# Patient Record
Sex: Male | Born: 1960 | Race: Black or African American | Hispanic: No | Marital: Single
Health system: Midwestern US, Community
[De-identification: ages and names within clinical notes are randomized; demographics above are authoritative.]

## PROBLEM LIST (undated history)

## (undated) HISTORY — PX: ANKLE SURGERY: SHX546

---

## 2013-04-10 ENCOUNTER — Emergency Department (HOSPITAL_COMMUNITY): Payer: Worker's Compensation

## 2013-04-10 ENCOUNTER — Encounter (HOSPITAL_COMMUNITY): Payer: Self-pay | Admitting: Emergency Medicine

## 2013-04-10 ENCOUNTER — Emergency Department (HOSPITAL_COMMUNITY)
Admission: EM | Admit: 2013-04-10 | Discharge: 2013-04-10 | Disposition: A | Discharge status: Home / Self Care | Payer: Worker's Compensation | Attending: Emergency Medicine | Admitting: Emergency Medicine

## 2013-04-10 DIAGNOSIS — Y92009 Unspecified place in unspecified non-institutional (private) residence as the place of occurrence of the external cause: Secondary | ICD-10-CM | POA: Insufficient documentation

## 2013-04-10 DIAGNOSIS — S51809A Unspecified open wound of unspecified forearm, initial encounter: Secondary | ICD-10-CM | POA: Insufficient documentation

## 2013-04-10 DIAGNOSIS — S6980XA Other specified injuries of unspecified wrist, hand and finger(s), initial encounter: Secondary | ICD-10-CM | POA: Insufficient documentation

## 2013-04-10 DIAGNOSIS — S6990XA Unspecified injury of unspecified wrist, hand and finger(s), initial encounter: Secondary | ICD-10-CM | POA: Insufficient documentation

## 2013-04-10 DIAGNOSIS — T148XXA Other injury of unspecified body region, initial encounter: Secondary | ICD-10-CM

## 2013-04-10 DIAGNOSIS — W540XXA Bitten by dog, initial encounter: Secondary | ICD-10-CM | POA: Insufficient documentation

## 2013-04-10 DIAGNOSIS — W230XXA Caught, crushed, jammed, or pinched between moving objects, initial encounter: Secondary | ICD-10-CM | POA: Insufficient documentation

## 2013-04-10 DIAGNOSIS — Y9389 Activity, other specified: Secondary | ICD-10-CM | POA: Insufficient documentation

## 2013-04-10 DIAGNOSIS — Y990 Civilian activity done for income or pay: Secondary | ICD-10-CM | POA: Insufficient documentation

## 2013-04-10 MED ORDER — AMOXICILLIN-POT CLAVULANATE 875-125 MG PO TABS
1.0000 | ORAL_TABLET | Freq: Once | ORAL | Status: AC
  Administered 2013-04-10: 1 via ORAL
  Filled 2013-04-10: qty 1

## 2013-04-10 MED ORDER — HYDROCODONE-ACETAMINOPHEN 5-325 MG PO TABS: 1.0000 | ORAL_TABLET | ORAL | Status: DC | PRN

## 2013-04-10 MED ORDER — AMOXICILLIN-POT CLAVULANATE 875-125 MG PO TABS: 1.0000 | ORAL_TABLET | Freq: Two times a day (BID) | ORAL | Status: DC

## 2013-04-10 NOTE — ED Notes (Signed)
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

## 2013-04-10 NOTE — ED Provider Notes (Signed)
Medical screening examination/treatment/procedure(s) were performed by non-physician practitioner and as supervising physician I was immediately available for consultation/collaboration.  EKG Interpretation   None        Courtney F Horton, MD 04/10/13 2327 

## 2013-04-10 NOTE — ED Provider Notes (Signed)
CSN: [id number]     Arrival date & time 04/10/13  1924 History  This chart was scribed for non-physician practitioner Ruby Cola, PA-C, working with Shon Baton, MD by Dorothey Baseman, ED Scribe. This patient was seen in room WTR6/WTR6 and the patient's care was started at 9:13 PM.    Chief Complaint  Patient presents with  . Animal Bite   The history is provided by the patient. No language interpreter was used.   HPI Comments: Mark Sullivan is a 52 y.o. male who presents to the Emergency Department complaining of an unprovoked dog bite with two puncture wounds to the left forearm that occurred around 4 hours ago. There is no active bleeding at this time. Patient reports that he was picking up donated furniture and when the owner came outside she let the dog out and it jumped up to "go for my throat" and he used his arm to shield himself.  Had a coat on but he bit through it. He reports that he hit the dog with his right hand to try and get it to let go and that caused his thumb to "jam," causing some limited range of motion of the thumb. Patient reports an associated, sore pain to the areas. He states that police and animal control were at the scene. Patient reports that his tetanus vaccination is UTD. He denies any allergies to medications. Patient has no other pertinent medical history.   History reviewed. No pertinent past medical history. Past Surgical History  Procedure Laterality Date  . Ankle surgery     Family History  Problem Relation Age of Onset  . Diabetes Other    History  Substance Use Topics  . Smoking status: Never Smoker   . Smokeless tobacco: Not on file  . Alcohol Use: Yes     Comment: rare    Review of Systems  A complete 10 system review of systems was obtained and all systems are negative except as noted in the HPI and PMH.   Allergies  Review of patient's allergies indicates no known allergies.  Home Medications  No current outpatient  prescriptions on file.  Triage Vitals: BP 146/96  Pulse 67  Temp(Src) 98.2 F (36.8 C) (Oral)  Resp 20  Ht 5\' 7"  (1.702 m)  Wt 186 lb 4 oz (84.482 kg)  BMI 29.16 kg/m2  SpO2 97%  Physical Exam  Nursing note and vitals reviewed. Constitutional: He is oriented to person, place, and time. He appears well-developed and well-nourished. No distress.  HENT:  Head: Normocephalic and atraumatic.  Eyes:  Normal appearance  Neck: Normal range of motion.  Cardiovascular: Intact distal pulses.   Pulmonary/Chest: Effort normal.  Musculoskeletal: Normal range of motion.  Neurological: He is alert and oriented to person, place, and time.  Distal sensation of all fingers is intact.   Skin:  Multiple tiny superficial and scabbed abrasions of extensor surface of right forearm.  No associated edema or ecchymosis.  All ttp.  There is a superficial, linear, hemostatic abrasion at distal forearm.  No bony tenderness.  Full ROM of elbow and wrist.  2+ radial pulse and distal sensation intact.  Right thumb w/out deformity, edema, ecchymosis.  Diffuse ttp and pain w/ passive flexion of DIP joint.  No tenderness at anatomical snuffbox. Distal sensation intact.   Psychiatric: He has a normal mood and affect. His behavior is normal.    ED Course  Procedures (including critical care time)  DIAGNOSTIC STUDIES: Oxygen Saturation is 97%  on room air, normal by my interpretation.    COORDINATION OF CARE: 9:18 PM- Will order an x-ray of the right thumb. Will discharge patient with antibiotics to treat possible infection. Discussed treatment plan with patient at bedside and patient verbalized agreement.     Labs Review Labs Reviewed - No data to display  Imaging Review Dg Finger Thumb Right  04/10/2013   CLINICAL DATA:  Recent traumatic injury with pain  EXAM: RIGHT THUMB 2+V  COMPARISON:  None.  FINDINGS: There is no evidence of fracture or dislocation. There is no evidence of arthropathy or other focal  bone abnormality. Soft tissues are unremarkable  IMPRESSION: No acute abnormality noted.   Electronically Signed   By: Alcide Clever M.D.   On: 04/10/2013 21:32    EKG Interpretation   None       MDM   1. Animal bite    Healthy 52yo M presents w/ c/o dog bite to L forearm that occurred this evening.  The dog apparently bit him through his coat.  Wounds on forearm are superficial and appear old, w/ exception of small, hemostatic scratch to extensor surface of wrist, and there are no holes in his coat.  I am suspicious that there is some secondary gain.  This is a worker's comp case.  The police and animal control were at scene.  Nursing staff cleaned wounds and he received a dose of augmentin.  Tetanus up to date.  Xray of R thumb ordered because he "jammed it" while defending himself and has pain and decreased ROM, but negative.     I personally performed the services described in this documentation, which was scribed in my presence. The recorded information has been reviewed and is accurate.     Otilio Miu, PA-C 04/10/13 2202

## 2014-06-17 IMAGING — CR DG FINGER THUMB 2+V*R*
3 series · 3 of 3 positions shown · non-contrast
Comparison: None.

CLINICAL DATA: Recent traumatic injury with pain

EXAM:
RIGHT THUMB 2+V

[x finger pa right]
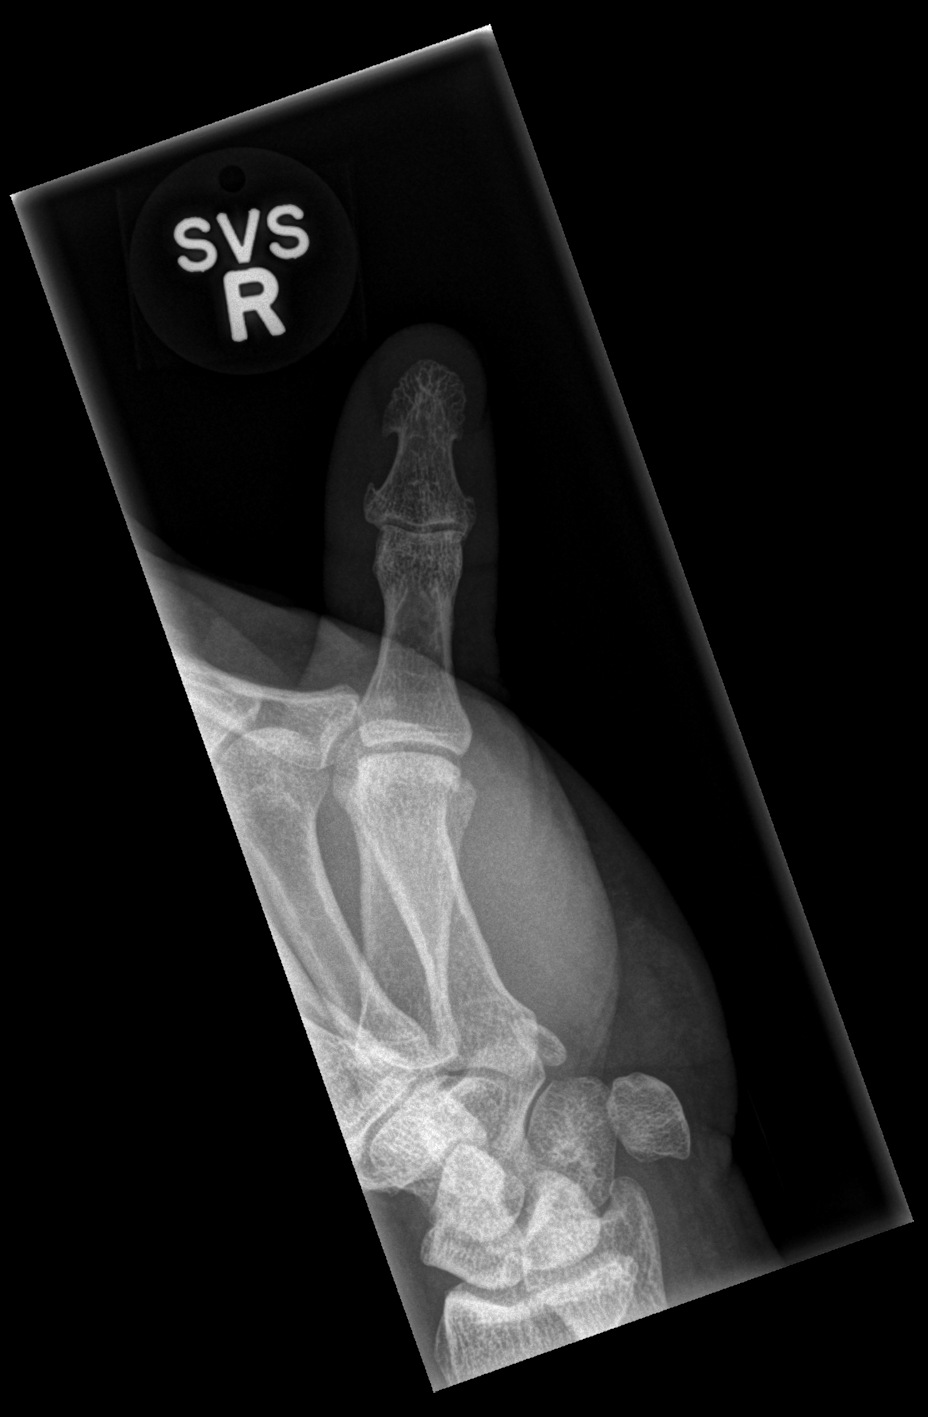

[x finger obl right]
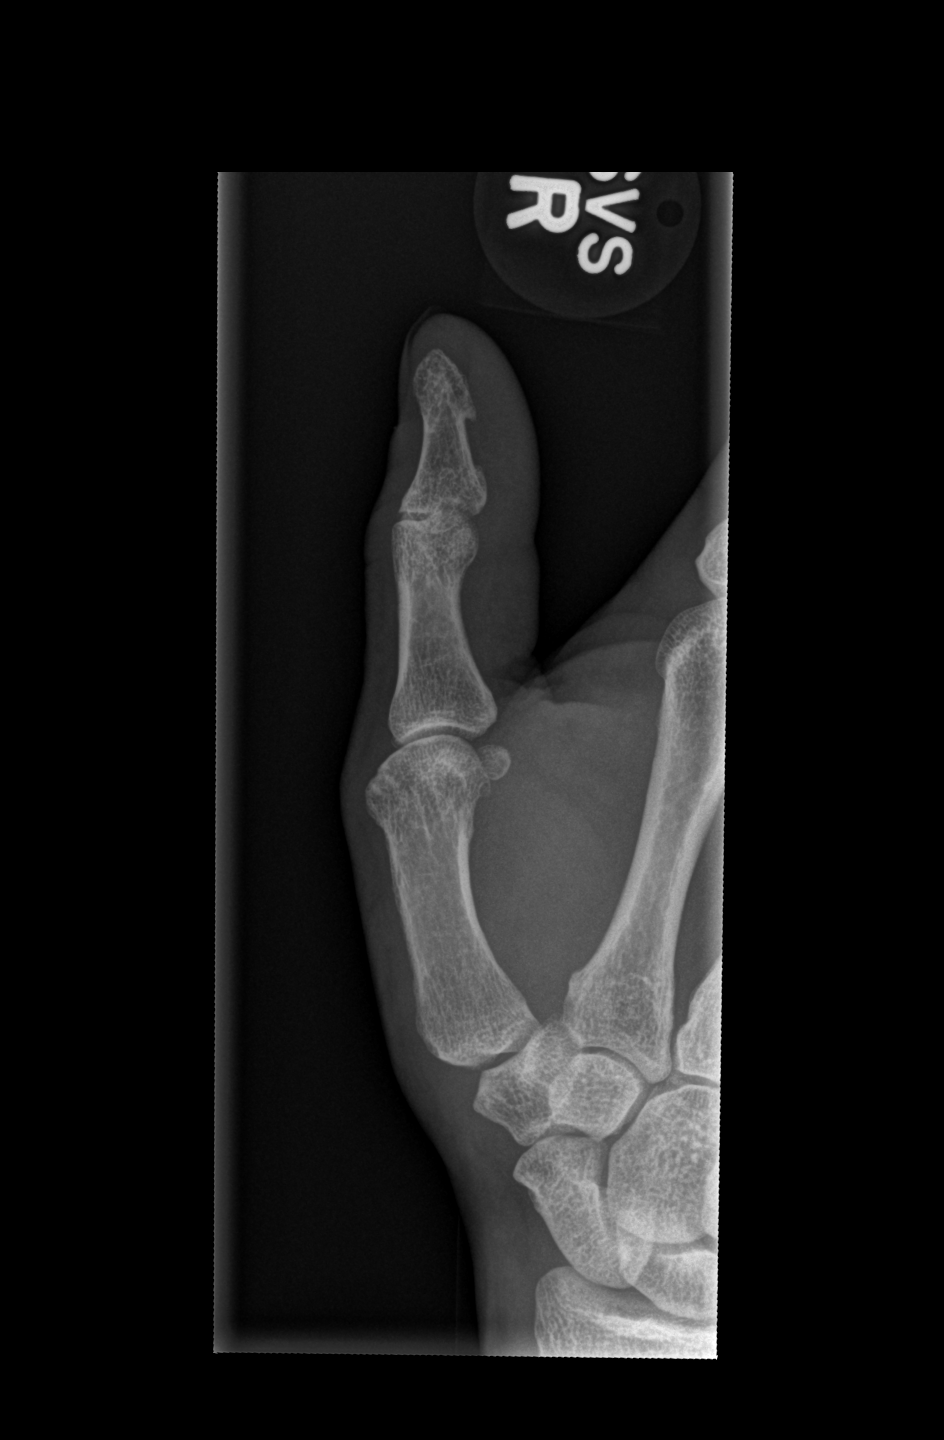

[x finger lat right]
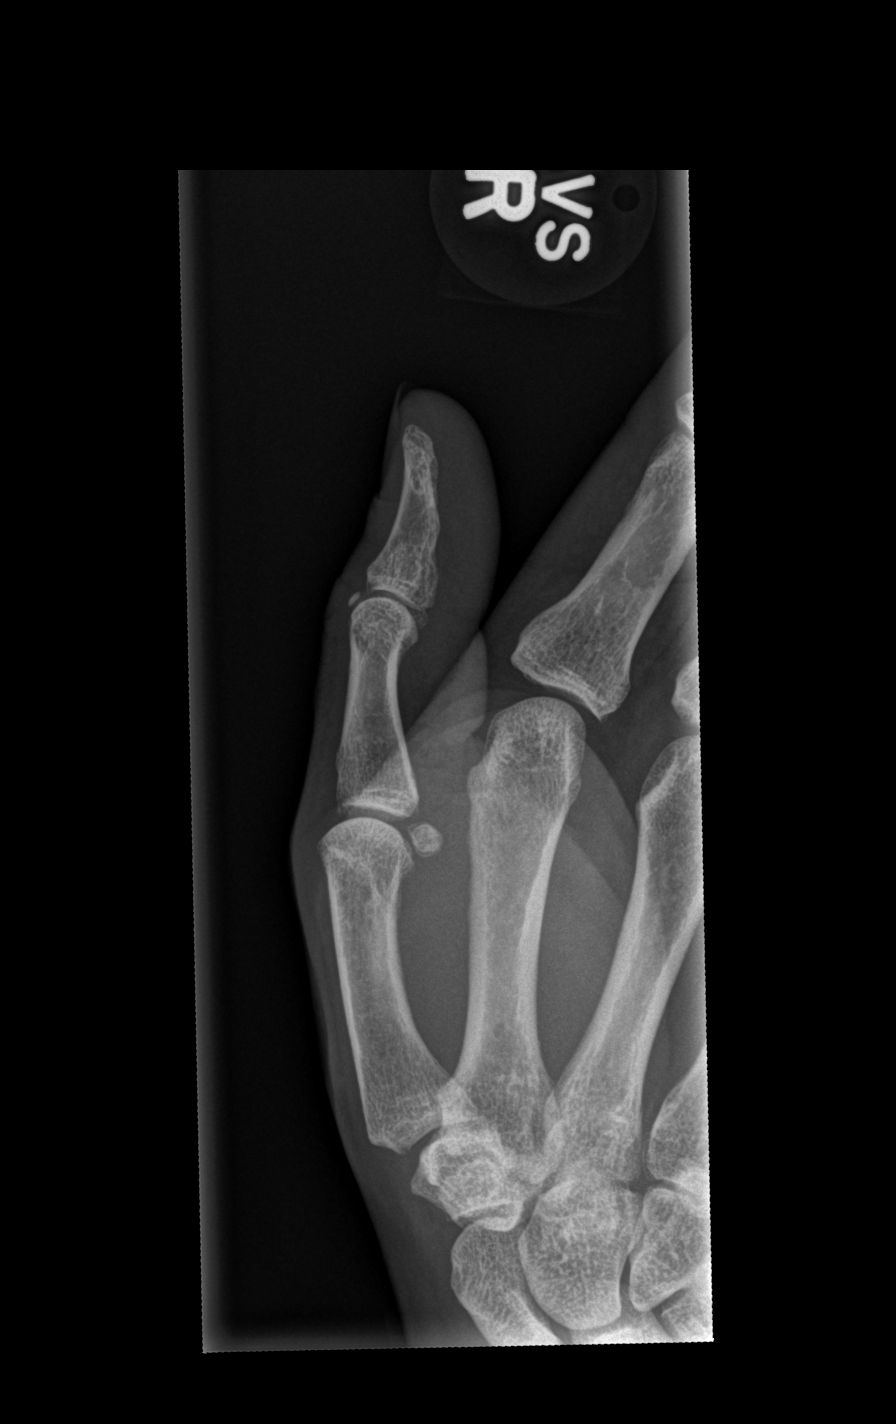

[3 of 3 positions shown; findings below may reference images not displayed]

FINDINGS: There is no evidence of fracture or dislocation. There is no
evidence of arthropathy or other focal bone abnormality. Soft
tissues are unremarkable
IMPRESSION: No acute abnormality noted.

## 2015-05-22 ENCOUNTER — Emergency Department (HOSPITAL_COMMUNITY)
Admission: EM | Admit: 2015-05-22 | Discharge: 2015-05-23 | Disposition: A | Discharge status: Home / Self Care | Payer: BLUE CROSS/BLUE SHIELD | Attending: Emergency Medicine | Admitting: Emergency Medicine

## 2015-05-22 ENCOUNTER — Encounter (HOSPITAL_COMMUNITY): Payer: Self-pay | Admitting: Emergency Medicine

## 2015-05-22 DIAGNOSIS — M791 Myalgia: Secondary | ICD-10-CM | POA: Insufficient documentation

## 2015-05-22 DIAGNOSIS — R61 Generalized hyperhidrosis: Secondary | ICD-10-CM | POA: Diagnosis not present

## 2015-05-22 DIAGNOSIS — R109 Unspecified abdominal pain: Secondary | ICD-10-CM | POA: Insufficient documentation

## 2015-05-22 DIAGNOSIS — R51 Headache: Secondary | ICD-10-CM | POA: Insufficient documentation

## 2015-05-22 DIAGNOSIS — H578 Other specified disorders of eye and adnexa: Secondary | ICD-10-CM | POA: Diagnosis not present

## 2015-05-22 DIAGNOSIS — R6883 Chills (without fever): Secondary | ICD-10-CM | POA: Diagnosis not present

## 2015-05-22 DIAGNOSIS — R112 Nausea with vomiting, unspecified: Secondary | ICD-10-CM | POA: Insufficient documentation

## 2015-05-22 DIAGNOSIS — R197 Diarrhea, unspecified: Secondary | ICD-10-CM | POA: Insufficient documentation

## 2015-05-22 LAB — URINALYSIS, ROUTINE W REFLEX MICROSCOPIC
Glucose, UA: NEGATIVE mg/dL
Hgb urine dipstick: NEGATIVE
Ketones, ur: NEGATIVE mg/dL
Nitrite: NEGATIVE
PH: 6 (ref 5.0–8.0)
Protein, ur: NEGATIVE mg/dL
SPECIFIC GRAVITY, URINE: 1.028 (ref 1.005–1.030)

## 2015-05-22 LAB — URINE MICROSCOPIC-ADD ON

## 2015-05-22 LAB — LIPASE, BLOOD: LIPASE: 27 U/L (ref 11–51)

## 2015-05-22 LAB — CBC
HEMATOCRIT: 46.1 % (ref 39.0–52.0)
HEMOGLOBIN: 15.9 g/dL (ref 13.0–17.0)
MCH: 32.1 pg (ref 26.0–34.0)
MCHC: 34.5 g/dL (ref 30.0–36.0)
MCV: 92.9 fL (ref 78.0–100.0)
Platelets: 219 10*3/uL (ref 150–400)
RBC: 4.96 MIL/uL (ref 4.22–5.81)
RDW: 13.6 % (ref 11.5–15.5)
WBC: 4.1 10*3/uL (ref 4.0–10.5)

## 2015-05-22 LAB — COMPREHENSIVE METABOLIC PANEL
ALBUMIN: 4.8 g/dL (ref 3.5–5.0)
ALT: 32 U/L (ref 17–63)
ANION GAP: 10 (ref 5–15)
AST: 43 U/L — ABNORMAL HIGH (ref 15–41)
Alkaline Phosphatase: 94 U/L (ref 38–126)
BILIRUBIN TOTAL: 1 mg/dL (ref 0.3–1.2)
BUN: 17 mg/dL (ref 6–20)
CO2: 26 mmol/L (ref 22–32)
Calcium: 10 mg/dL (ref 8.9–10.3)
Chloride: 104 mmol/L (ref 101–111)
Creatinine, Ser: 1.02 mg/dL (ref 0.61–1.24)
GFR calc Af Amer: >60 mL/min (ref >60)
GFR calc non Af Amer: >60 mL/min (ref >60)
GLUCOSE: 116 mg/dL — AB (ref 65–99)
POTASSIUM: 3.7 mmol/L (ref 3.5–5.1)
SODIUM: 140 mmol/L (ref 135–145)
Total Protein: 8.8 g/dL — ABNORMAL HIGH (ref 6.5–8.1)

## 2015-05-22 MED ORDER — KETOROLAC TROMETHAMINE 30 MG/ML IJ SOLN
30.0000 mg | Freq: Once | INTRAMUSCULAR | Status: AC
  Administered 2015-05-22: 30 mg via INTRAVENOUS
  Filled 2015-05-22: qty 1

## 2015-05-22 MED ORDER — METOCLOPRAMIDE HCL 5 MG/ML IJ SOLN
10.0000 mg | Freq: Once | INTRAMUSCULAR | Status: AC
  Administered 2015-05-22: 10 mg via INTRAVENOUS
  Filled 2015-05-22: qty 2

## 2015-05-22 MED ORDER — SODIUM CHLORIDE 0.9 % IV SOLN
1000.0000 mL | Freq: Once | INTRAVENOUS | Status: AC
  Administered 2015-05-22: 1000 mL via INTRAVENOUS

## 2015-05-22 MED ORDER — SODIUM CHLORIDE 0.9 % IV SOLN
1000.0000 mL | INTRAVENOUS | Status: DC
  Administered 2015-05-22: 1000 mL via INTRAVENOUS

## 2015-05-22 MED ORDER — DIPHENHYDRAMINE HCL 50 MG/ML IJ SOLN
25.0000 mg | Freq: Once | INTRAMUSCULAR | Status: AC
  Administered 2015-05-22: 25 mg via INTRAVENOUS
  Filled 2015-05-22: qty 1

## 2015-05-22 MED ORDER — LOPERAMIDE HCL 2 MG PO CAPS
4.0000 mg | ORAL_CAPSULE | Freq: Once | ORAL | Status: AC
  Administered 2015-05-22: 4 mg via ORAL
  Filled 2015-05-22: qty 2

## 2015-05-22 NOTE — ED Notes (Signed)
Pt c/o nausea, diarrhea and abd bloating since Wednesday. Pt also c/o burning behind eyes that started today.

## 2015-05-22 NOTE — ED Provider Notes (Signed)
CSN: [id number]     Arrival date & time 05/22/15  2103 History  By signing my name below, I, Lyndel Safe, attest that this documentation has been prepared under the direction and in the presence of Dione Booze, MD. Electronically Signed: Lyndel Safe, ED Scribe. 05/22/2015. 11:21 PM.  Chief Complaint  Patient presents with  . Nausea  . Eye Problem  . Diarrhea  . Bloated   The history is provided by the patient. No language interpreter was used.   HPI Comments: Mark Sullivan is a 55 y.o. male, with no pertinent PMhx, who presents to the Emergency Department complaining of worsening, constant, abdominal cramping with nausea onset 1 day ago, with onset of episodes of diarrhea beginning this morning and continuing throughout the day. He associates 6/10 generalized myalgias, chills, diaphoresis, and an aching headache behind his eyes. The pt states he has been unable to eat today and admits to decreased fluid intake due to nausea and abdominal cramping. He denies vomiting, hematochezia, or any known sick contacts.   History reviewed. No pertinent past medical history. Past Surgical History  Procedure Laterality Date  . Ankle surgery     Family History  Problem Relation Age of Onset  . Diabetes Other    Social History  Substance Use Topics  . Smoking status: Never Smoker   . Smokeless tobacco: None  . Alcohol Use: Yes     Comment: rare    Review of Systems  Constitutional: Positive for chills and diaphoresis.  Gastrointestinal: Positive for nausea, abdominal pain and diarrhea. Negative for vomiting and blood in stool.  Musculoskeletal: Positive for myalgias ( generalized).  Neurological: Positive for headaches.  All other systems reviewed and are negative.  Allergies  Review of patient's allergies indicates no known allergies.  Home Medications   Prior to Admission medications   Medication Sig Start Date End Date Taking? Authorizing Provider  amoxicillin-clavulanate  (AUGMENTIN) 875-125 MG per tablet Take 1 tablet by mouth every 12 (twelve) hours. Patient not taking: Reported on 05/22/2015 04/10/13   Ruby Cola, PA-C  HYDROcodone-acetaminophen (NORCO/VICODIN) 5-325 MG per tablet Take 1 tablet by mouth every 4 (four) hours as needed for moderate pain. Patient not taking: Reported on 05/22/2015 04/10/13   Ruby Cola, PA-C   BP 164/91 mmHg  Pulse 81  Temp(Src) 97.9 F (36.6 C) (Oral)  Resp 18  SpO2 100% Physical Exam  Constitutional: He is oriented to person, place, and time. He appears well-developed and well-nourished.  HENT:  Head: Normocephalic and atraumatic.  Eyes: Conjunctivae and EOM are normal. Pupils are equal, round, and reactive to light.  Neck: Normal range of motion. Neck supple. No JVD present.  Cardiovascular: Normal rate and regular rhythm.   No murmur heard. Pulmonary/Chest: Effort normal and breath sounds normal. He has no wheezes. He has no rales. He exhibits no tenderness.  Abdominal: Soft. He exhibits no distension and no mass. There is no tenderness.  Decreased bowel sounds.   Musculoskeletal: Normal range of motion. He exhibits no edema.  Lymphadenopathy:    He has no cervical adenopathy.  Neurological: He is alert and oriented to person, place, and time. No cranial nerve deficit. He exhibits normal muscle tone. Coordination normal.  Skin: Skin is warm and dry. No rash noted.  Psychiatric: He has a normal mood and affect. His behavior is normal. Judgment and thought content normal.  Nursing note and vitals reviewed.   ED Course  Procedures  DIAGNOSTIC STUDIES: Oxygen Saturation is 100% on RA, normal  by my interpretation.    COORDINATION OF CARE: 11:13 PM Discussed treatment plan which includes to order diagnostic labs and IV fluids with pt. Pt acknowledges and agrees to plan.   Labs Review Results for orders placed or performed during the hospital encounter of 05/22/15  Lipase, blood  Result Value  Ref Range   Lipase 27 11 - 51 U/L  Comprehensive metabolic panel  Result Value Ref Range   Sodium 140 135 - 145 mmol/L   Potassium 3.7 3.5 - 5.1 mmol/L   Chloride 104 101 - 111 mmol/L   CO2 26 22 - 32 mmol/L   Glucose, Bld 116 (H) 65 - 99 mg/dL   BUN 17 6 - 20 mg/dL   Creatinine, Ser 1.61 0.61 - 1.24 mg/dL   Calcium 09.6 8.9 - 04.5 mg/dL   Total Protein 8.8 (H) 6.5 - 8.1 g/dL   Albumin 4.8 3.5 - 5.0 g/dL   AST 43 (H) 15 - 41 U/L   ALT 32 17 - 63 U/L   Alkaline Phosphatase 94 38 - 126 U/L   Total Bilirubin 1.0 0.3 - 1.2 mg/dL   GFR calc non Af Amer >60 >60 mL/min   GFR calc Af Amer >60 >60 mL/min   Anion gap 10 5 - 15  CBC  Result Value Ref Range   WBC 4.1 4.0 - 10.5 K/uL   RBC 4.96 4.22 - 5.81 MIL/uL   Hemoglobin 15.9 13.0 - 17.0 g/dL   HCT 40.9 81.1 - 91.4 %   MCV 92.9 78.0 - 100.0 fL   MCH 32.1 26.0 - 34.0 pg   MCHC 34.5 30.0 - 36.0 g/dL   RDW 78.2 95.6 - 21.3 %   Platelets 219 150 - 400 K/uL  Urinalysis, Routine w reflex microscopic (not at [facility])  Result Value Ref Range   Color, Urine AMBER (A) YELLOW   APPearance CLOUDY (A) CLEAR   Specific Gravity, Urine 1.028 1.005 - 1.030   pH 6.0 5.0 - 8.0   Glucose, UA NEGATIVE NEGATIVE mg/dL   Hgb urine dipstick NEGATIVE NEGATIVE   Bilirubin Urine SMALL (A) NEGATIVE   Ketones, ur NEGATIVE NEGATIVE mg/dL   Protein, ur NEGATIVE NEGATIVE mg/dL   Nitrite NEGATIVE NEGATIVE   Leukocytes, UA TRACE (A) NEGATIVE  Urine microscopic-add on  Result Value Ref Range   Squamous Epithelial / LPF 0-5 (A) NONE SEEN   WBC, UA 0-5 0 - 5 WBC/hpf   RBC / HPF 0-5 0 - 5 RBC/hpf   Bacteria, UA RARE (A) NONE SEEN   I have personally reviewed and evaluated these lab results as part of my medical decision-making.   MDM   Final diagnoses:  Nausea vomiting and diarrhea    Nausea, vomiting, diarrhea in pattern suggestive of viral gastroenteritis. No red flags to suggest more serious illness. Laboratory workup is reassuring. Urinalysis suggests  some degree of dehydration. Is given IV fluids as well as IV metoclopramide and oral loperamide with significant subjective improvement. He is discharged with prescription for ondansetron and told to take over-the-counter loperamide.  I personally performed the services described in this documentation, which was scribed in my presence. The recorded information has been reviewed and is accurate.      Dione Booze, MD 05/23/15 [phone]

## 2015-05-23 MED ORDER — ONDANSETRON HCL 4 MG PO TABS: 4.0000 mg | ORAL_TABLET | Freq: Four times a day (QID) | ORAL | Status: AC | PRN

## 2015-05-23 NOTE — ED Notes (Signed)
Pt is resting comfortably in bed, pain improved and now rated 4/10.  Provided ginger ale per request, no acute distress noted.

## 2015-05-23 NOTE — Discharge Instructions (Signed)
Take loperamide (Imodium AD) as needed for diarrhea. ° °Nausea and Vomiting °Nausea is a sick feeling that often comes before throwing up (vomiting). Vomiting is a reflex where stomach contents come out of your mouth. Vomiting can cause severe loss of body fluids (dehydration). Children and elderly adults can become dehydrated quickly, especially if they also have diarrhea. Nausea and vomiting are symptoms of a condition or disease. It is important to find the cause of your symptoms. °CAUSES  °· Direct irritation of the stomach lining. This irritation can result from increased acid production (gastroesophageal reflux disease), infection, food poisoning, taking certain medicines (such as nonsteroidal anti-inflammatory drugs), alcohol use, or tobacco use. °· Signals from the brain. These signals could be caused by a headache, heat exposure, an inner ear disturbance, increased pressure in the brain from injury, infection, a tumor, or a concussion, pain, emotional stimulus, or metabolic problems. °· An obstruction in the gastrointestinal tract (bowel obstruction). °· Illnesses such as diabetes, hepatitis, gallbladder problems, appendicitis, kidney problems, cancer, sepsis, atypical symptoms of a heart attack, or eating disorders. °· Medical treatments such as chemotherapy and radiation. °· Receiving medicine that makes you sleep (general anesthetic) during surgery. °DIAGNOSIS °Your caregiver may ask for tests to be done if the problems do not improve after a few days. Tests may also be done if symptoms are severe or if the reason for the nausea and vomiting is not clear. Tests may include: °· Urine tests. °· Blood tests. °· Stool tests. °· Cultures (to look for evidence of infection). °· X-rays or other imaging studies. °Test results can help your caregiver make decisions about treatment or the need for additional tests. °TREATMENT °You need to stay well hydrated. Drink frequently but in small amounts. You may wish to  drink water, sports drinks, clear broth, or eat frozen ice pops or gelatin dessert to help stay hydrated. When you eat, eating slowly may help prevent nausea. There are also some antinausea medicines that may help prevent nausea. °HOME CARE INSTRUCTIONS  °· Take all medicine as directed by your caregiver. °· If you do not have an appetite, do not force yourself to eat. However, you must continue to drink fluids. °· If you have an appetite, eat a normal diet unless your caregiver tells you differently. °· Eat a variety of complex carbohydrates (rice, wheat, potatoes, bread), lean meats, yogurt, fruits, and vegetables. °· Avoid high-fat foods because they are more difficult to digest. °· Drink enough water and fluids to keep your urine clear or pale yellow. °· If you are dehydrated, ask your caregiver for specific rehydration instructions. Signs of dehydration may include: °· Severe thirst. °· Dry lips and mouth. °· Dizziness. °· Dark urine. °· Decreasing urine frequency and amount. °· Confusion. °· Rapid breathing or pulse. °SEEK IMMEDIATE MEDICAL CARE IF:  °· You have blood or brown flecks (like coffee grounds) in your vomit. °· You have black or bloody stools. °· You have a severe headache or stiff neck. °· You are confused. °· You have severe abdominal pain. °· You have chest pain or trouble breathing. °· You do not urinate at least once every 8 hours. °· You develop cold or clammy skin. °· You continue to vomit for longer than 24 to 48 hours. °· You have a fever. °MAKE SURE YOU:  °· Understand these instructions. °· Will watch your condition. °· Will get help right away if you are not doing well or get worse. °  °This information is not intended to replace advice given   to you by your health care provider. Make sure you discuss any questions you have with your health care provider. °  °Document Released: 04/12/2005 Document Revised: 07/05/2011 Document Reviewed: 09/09/2010 °Elsevier Interactive Patient Education  ©2016 [employer] ° °Diarrhea °Diarrhea is frequent loose and watery bowel movements. It can cause you to feel weak and dehydrated. Dehydration can cause you to become tired and thirsty, have a dry mouth, and have decreased urination that often is dark yellow. Diarrhea is a sign of another problem, most often an infection that will not last long. In most cases, diarrhea typically lasts 2-3 days. However, it can last longer if it is a sign of something more serious. It is important to treat your diarrhea as directed by your caregiver to lessen or prevent future episodes of diarrhea. °CAUSES  °Some common causes include: °· Gastrointestinal infections caused by viruses, bacteria, or parasites. °· Food poisoning or food allergies. °· Certain medicines, such as antibiotics, chemotherapy, and laxatives. °· Artificial sweeteners and fructose. °· Digestive disorders. °HOME CARE INSTRUCTIONS °· Ensure adequate fluid intake (hydration): Have 1 cup (8 oz) of fluid for each diarrhea episode. Avoid fluids that contain simple sugars or sports drinks, fruit juices, whole milk products, and sodas. Your urine should be clear or pale yellow if you are drinking enough fluids. Hydrate with an oral rehydration solution that you can purchase at pharmacies, retail stores, and online. You can prepare an oral rehydration solution at home by mixing the following ingredients together: °¨  - tsp table salt. °¨ ¾ tsp baking soda. °¨  tsp salt substitute containing potassium chloride. °¨ 1  tablespoons sugar. °¨ 1 L (34 oz) of water. °· Certain foods and beverages may increase the speed at which food moves through the gastrointestinal (GI) tract. These foods and beverages should be avoided and include: °¨ Caffeinated and alcoholic beverages. °¨ High-fiber foods, such as raw fruits and vegetables, nuts, seeds, and whole grain breads and cereals. °¨ Foods and beverages sweetened with sugar alcohols, such as xylitol, sorbitol, and  mannitol. °· Some foods may be well tolerated and may help thicken stool including: °¨ Starchy foods, such as rice, toast, pasta, low-sugar cereal, oatmeal, grits, baked potatoes, crackers, and bagels. °¨ Bananas. °¨ Applesauce. °· Add probiotic-rich foods to help increase healthy bacteria in the GI tract, such as yogurt and fermented milk products. °· Wash your hands well after each diarrhea episode. °· Only take over-the-counter or prescription medicines as directed by your caregiver. °· Take a warm bath to relieve any burning or pain from frequent diarrhea episodes. °SEEK IMMEDIATE MEDICAL CARE IF:  °· You are unable to keep fluids down. °· You have persistent vomiting. °· You have blood in your stool, or your stools are black and tarry. °· You do not urinate in 6-8 hours, or there is only a small amount of very dark urine. °· You have abdominal pain that increases or localizes. °· You have weakness, dizziness, confusion, or light-headedness. °· You have a severe headache. °· Your diarrhea gets worse or does not get better. °· You have a fever or persistent symptoms for more than 2-3 days. °· You have a fever and your symptoms suddenly get worse. °MAKE SURE YOU:  °· Understand these instructions. °· Will watch your condition. °· Will get help right away if you are not doing well or get worse. °  °This information is not intended to replace advice given to you by your health care provider. Make sure you   discuss any questions you have with your health care provider. °  °Document Released: 04/02/2002 Document Revised: 05/03/2014 Document Reviewed: 12/19/2011 °Elsevier Interactive Patient Education ©2016 [employer]. ° °Ondansetron tablets °What is this medicine? °ONDANSETRON (on DAN se tron) is used to treat nausea and vomiting caused by chemotherapy. It is also used to prevent or treat nausea and vomiting after surgery. °This medicine may be used for other purposes; ask your health care provider or pharmacist if  you have questions. °What should I tell my health care provider before I take this medicine? °They need to know if you have any of these conditions: °-heart disease °-history of irregular heartbeat °-liver disease °-low levels of magnesium or potassium in the blood °-an unusual or allergic reaction to ondansetron, granisetron, other medicines, foods, dyes, or preservatives °-pregnant or trying to get pregnant °-breast-feeding °How should I use this medicine? °Take this medicine by mouth with a glass of water. Follow the directions on your prescription label. Take your doses at regular intervals. Do not take your medicine more often than directed. °Talk to your pediatrician regarding the use of this medicine in children. Special care may be needed. °Overdosage: If you think you have taken too much of this medicine contact a poison control center or emergency room at once. °NOTE: This medicine is only for you. Do not share this medicine with others. °What if I miss a dose? °If you miss a dose, take it as soon as you can. If it is almost time for your next dose, take only that dose. Do not take double or extra doses. °What may interact with this medicine? °Do not take this medicine with any of the following medications: °-apomorphine °-certain medicines for fungal infections like fluconazole, itraconazole, ketoconazole, posaconazole, voriconazole °-cisapride °-dofetilide °-dronedarone °-pimozide °-thioridazine °-ziprasidone °This medicine may also interact with the following medications: °-carbamazepine °-certain medicines for depression, anxiety, or psychotic disturbances °-fentanyl °-linezolid °-MAOIs like Carbex, Eldepryl, Marplan, Nardil, and Parnate °-methylene blue (injected into a vein) °-other medicines that prolong the QT interval (cause an abnormal heart rhythm) °-phenytoin °-rifampicin °-tramadol °This list may not describe all possible interactions. Give your health care provider a list of all the  medicines, herbs, non-prescription drugs, or dietary supplements you use. Also tell them if you smoke, drink alcohol, or use illegal drugs. Some items may interact with your medicine. °What should I watch for while using this medicine? °Check with your doctor or health care professional right away if you have any sign of an allergic reaction. °What side effects may I notice from receiving this medicine? °Side effects that you should report to your doctor or health care professional as soon as possible: °-allergic reactions like skin rash, itching or hives, swelling of the face, lips or tongue °-breathing problems °-confusion °-dizziness °-fast or irregular heartbeat °-feeling faint or lightheaded, falls °-fever and chills °-loss of balance or coordination °-seizures °-sweating °-swelling of the hands or feet °-tightness in the chest °-tremors °-unusually weak or tired °Side effects that usually do not require medical attention (report to your doctor or health care professional if they continue or are bothersome): °-constipation or diarrhea °-headache °This list may not describe all possible side effects. Call your doctor for medical advice about side effects. You may report side effects to FDA at 1-800-FDA-1088. °Where should I keep my medicine? °Keep out of the reach of children. °Store between 2 and 30 degrees C (36 and 86 degrees F). Throw away any unused medicine after the expiration   date. °NOTE: This sheet is a summary. It may not cover all possible information. If you have questions about this medicine, talk to your doctor, pharmacist, or health care provider. °  °© 2016, Elsevier/[employer]. (2013-01-17 16:27:45) ° °

## 2017-12-23 ENCOUNTER — Emergency Department: Admit: 2017-12-24 | Payer: MEDICAID

## 2017-12-23 DIAGNOSIS — L02414 Cutaneous abscess of left upper limb: Secondary | ICD-10-CM

## 2017-12-23 NOTE — ED Provider Notes (Signed)
ED Provider Notes by Massie Kluver, PA-C at 12/23/17 2143                Author: Massie Kluver, PA-C  Service: Emergency Medicine  Author Type: Physician Assistant       Filed: 12/23/17 2328  Date of Service: 12/23/17 2143  Status: Attested           Editor: Lorne Skeens (Physician Assistant)  Cosigner: Verl Bangs, MD at 12/24/17 1615          Attestation signed by Verl Bangs, MD at 12/24/17 1615          I was personally available for consultation in the emergency department.  I have reviewed the chart and agree with the documentation recorded by the [facility], including  the assessment, treatment plan, and disposition.   Verl Bangs, MD                                    57 yo male presents to ER for evaluation of forearm abscess. Abscess has been present for 5 days. Pt seen at [facility]  at started on keflex. Pt reports that it was lanced. Pt reports no improvement. Pt with subjective fever. + nausea and 4 episodes of intermittent vomiting. Pt not taking anything for pain.    Pain worse with pressure on arm.      Social hx   +smoker   Denies injection drug use.      The history is provided by the patient. No language interpreter  was used.           No past medical history on file.      No past surgical history on file.        No family history on file.        Social History          Socioeconomic History         ?  Marital status:  Not on file              Spouse name:  Not on file         ?  Number of children:  Not on file     ?  Years of education:  Not on file     ?  Highest education level:  Not on file       Occupational History        ?  Not on file       Social Needs         ?  Financial resource strain:  Not on file        ?  Food insecurity:              Worry:  Not on file         Inability:  Not on file        ?  Transportation needs:              Medical:  Not on file         Non-medical:  Not on file       Tobacco Use         ?  Smoking status:  Not on file        Substance and Sexual Activity         ?  Alcohol use:  Not on  file     ?  Drug use:  Not on file     ?  Sexual activity:  Not on file       Lifestyle        ?  Physical activity:              Days per week:  Not on file         Minutes per session:  Not on file         ?  Stress:  Not on file       Relationships        ?  Social connections:              Talks on phone:  Not on file         Gets together:  Not on file         Attends religious service:  Not on file         Active member of club or organization:  Not on file         Attends meetings of clubs or organizations:  Not on file         Relationship status:  Not on file        ?  Intimate partner violence:              Fear of current or ex partner:  Not on file         Emotionally abused:  Not on file         Physically abused:  Not on file         Forced sexual activity:  Not on file        Other Topics  Concern        ?  Not on file       Social History Narrative        ?  Not on file              ALLERGIES: Patient has no allergy information on record.      Review of Systems    Constitutional: Negative for chills and fever.    Respiratory: Negative for cough, chest tightness and shortness of breath.     Gastrointestinal: Positive for nausea and vomiting . Negative for abdominal pain.    Musculoskeletal: Negative for back pain and neck pain.    Skin: Positive for wound.            Vitals:          12/23/17 2114        Pulse:  88        SpO2:  99%                Physical Exam    Constitutional: He is oriented to person, place, and time. He appears well-developed and well-nourished. No distress.    HENT:    Head: Normocephalic and atraumatic.    Eyes: Pupils are equal, round, and reactive to light. Conjunctivae are normal.    Neck: Normal range of motion. Neck supple.    Cardiovascular: Normal rate and regular rhythm.    Pulmonary/Chest: Effort normal. No respiratory distress.   Musculoskeletal: Normal range of motion.    Left forearm:  Dime sized healing  abscess. . No cellulitis.  No fluctuance.  No lymphangitis.    Neurological: He is alert and oriented to person, place, and time.    Skin: Skin is warm. No erythema.  Psychiatric: He has a normal mood and affect. His behavior is normal. Judgment and thought  content normal.    Nursing note and vitals reviewed.          MDM   Number of Diagnoses or Management Options   Abscess:    Diagnosis management comments: 57 year old male presenting for abscess to the forearm.  There is a healing abscess.  No palpable fluctuance.  No surrounding cellulitis or lymphangitis.  He is afebrile.   Plan: Ultrasound.      Ultrasound does not show any drainable fluid collection.   Patient is nontoxic-appearing.  We will have him continue the Keflex but will add Bactrim for further antibiotic coverage.  Give small dose of pain medication patient will follow-up as needed.      Standard narcotic and sedating medication warnings given   Patient's results have been reviewed with them.  Patient and/or family have verbally conveyed their understanding and agreement of the patient's signs, symptoms, diagnosis, treatment and prognosis and additionally agree to follow up as recommended or  return to the Emergency Room should their condition change prior to follow-up.  Discharge instructions have also been provided to the patient with some educational information regarding their diagnosis as well a list of reasons why they would want to  return to the ER prior to their follow-up appointment should their condition change.                      Amount and/or Complexity of Data Reviewed   Discuss the patient with other providers: yes (ER attending-Leary)      Patient Progress   Patient progress: stable             Procedures         Pt case including HPI, PE, and all available lab and radiology results has been discussed with attending physician. Opportunity to evaluate patient has been provided to ER attending.   Discharge and prescription plan has  been agreed upon.

## 2017-12-23 NOTE — ED Notes (Signed)
Patient presents to the emergency department reporting an abscess to the left forearm.  Patient [facility], and was prescribed Keflex after it was drained.  Patient reports he still has pain.

## 2017-12-23 NOTE — ED Notes (Signed)
Donald NovemberMike, Donald Sparks reviewed discharge instructions with the patient.  The patient verbalized understanding.    Patient ambulated out of the emergency department without assistance.  Patient is in no apparent distress.      Patient did not have anyplace to go.  Patient advised he cannot stay in the waiting room all night with no place to go.

## 2017-12-23 NOTE — ED Provider Notes (Signed)
57 yo male presents to ER for evaluation of forearm abscess. Abscess has been present for 5 days. Pt seen at [facility] at started on keflex. Pt reports that it was lanced. Pt reports no improvement. Pt with subjective fever. + nausea and 4 episodes of intermittent vomiting. Pt not taking anything for pain.   Pain worse with pressure on arm.    Social hx  +smoker  Denies injection drug use.    The history is provided by the patient. No language interpreter was used.        No past medical history on file.    No past surgical history on file.      No family history on file.    Social History     Socioeconomic History   ??? Marital status: Not on file     Spouse name: Not on file   ??? Number of children: Not on file   ??? Years of education: Not on file   ??? Highest education level: Not on file   Occupational History   ??? Not on file   Social Needs   ??? Financial resource strain: Not on file   ??? Food insecurity:     Worry: Not on file     Inability: Not on file   ??? Transportation needs:     Medical: Not on file     Non-medical: Not on file   Tobacco Use   ??? Smoking status: Not on file   Substance and Sexual Activity   ??? Alcohol use: Not on file   ??? Drug use: Not on file   ??? Sexual activity: Not on file   Lifestyle   ??? Physical activity:     Days per week: Not on file     Minutes per session: Not on file   ??? Stress: Not on file   Relationships   ??? Social connections:     Talks on phone: Not on file     Gets together: Not on file     Attends religious service: Not on file     Active member of club or organization: Not on file     Attends meetings of clubs or organizations: Not on file     Relationship status: Not on file   ??? Intimate partner violence:     Fear of current or ex partner: Not on file     Emotionally abused: Not on file     Physically abused: Not on file     Forced sexual activity: Not on file   Other Topics Concern   ??? Not on file   Social History Narrative   ??? Not on file          ALLERGIES: Patient has no allergy information on record.    Review of Systems   Constitutional: Negative for chills and fever.   Respiratory: Negative for cough, chest tightness and shortness of breath.    Gastrointestinal: Positive for nausea and vomiting. Negative for abdominal pain.   Musculoskeletal: Negative for back pain and neck pain.   Skin: Positive for wound.       Vitals:    12/23/17 2114   Pulse: 88   SpO2: 99%            Physical Exam   Constitutional: He is oriented to person, place, and time. He appears well-developed and well-nourished. No distress.   HENT:   Head: Normocephalic and atraumatic.   Eyes: Pupils are equal, round, and reactive  to light. Conjunctivae are normal.   Neck: Normal range of motion. Neck supple.   Cardiovascular: Normal rate and regular rhythm.   Pulmonary/Chest: Effort normal. No respiratory distress.   Musculoskeletal: Normal range of motion.   Left forearm:  Dime sized healing abscess. . No cellulitis.  No fluctuance.  No lymphangitis.   Neurological: He is alert and oriented to person, place, and time.   Skin: Skin is warm. No erythema.   Psychiatric: He has a normal mood and affect. His behavior is normal. Judgment and thought content normal.   Nursing note and vitals reviewed.       MDM  Number of Diagnoses or Management Options  Abscess:   Diagnosis management comments: 57 year old male presenting for abscess to the forearm.  There is a healing abscess.  No palpable fluctuance.  No surrounding cellulitis or lymphangitis.  He is afebrile.  Plan: Ultrasound.    Ultrasound does not show any drainable fluid collection.  Patient is nontoxic-appearing.  We will have him continue the Keflex but will add Bactrim for further antibiotic coverage.  Give small dose of pain medication patient will follow-up as needed.    Standard narcotic and sedating medication warnings given  Patient's results have been reviewed with them.  Patient and/or family  have verbally conveyed their understanding and agreement of the patient's signs, symptoms, diagnosis, treatment and prognosis and additionally agree to follow up as recommended or return to the Emergency Room should their condition change prior to follow-up.  Discharge instructions have also been provided to the patient with some educational information regarding their diagnosis as well a list of reasons why they would want to return to the ER prior to their follow-up appointment should their condition change.               Amount and/or Complexity of Data Reviewed  Discuss the patient with other providers: yes (ER attending-Leary)    Patient Progress  Patient progress: stable         Procedures      Pt case including HPI, PE, and all available lab and radiology results has been discussed with attending physician. Opportunity to evaluate patient has been provided to ER attending.  Discharge and prescription plan has been agreed upon.

## 2017-12-23 NOTE — ED Triage Notes (Signed)
Patient presents to the emergency department reporting an abscess to the left forearm.  Patient reports being seen at [facility] for the same, and was prescribed Keflex after it was drained.  Patient reports he still has pain.

## 2017-12-23 NOTE — ED Notes (Signed)
Mike, PA reviewed discharge instructions with the patient.  The patient verbalized understanding.    Patient ambulated out of the emergency department without assistance.  Patient is in no apparent distress.      Patient did not have anyplace to go.  Patient advised he cannot stay in the waiting room all night with no place to go.

## 2017-12-24 ENCOUNTER — Inpatient Hospital Stay
Admit: 2017-12-24 | Discharge: 2017-12-24 | Disposition: A | Discharge status: Home / Self Care | Payer: MEDICAID | Attending: Emergency Medicine

## 2017-12-24 MED ORDER — HYDROCODONE-ACETAMINOPHEN 5 MG-325 MG TAB
5-325 mg | ORAL | Status: AC
Start: 2017-12-24 — End: 2017-12-23
  Administered 2017-12-24: 02:00:00 via ORAL

## 2017-12-24 MED ORDER — TRIMETHOPRIM-SULFAMETHOXAZOLE 160 MG-800 MG TAB
160-800 mg | ORAL_TABLET | Freq: Two times a day (BID) | ORAL | 0 refills | Status: AC
Start: 2017-12-24 — End: 2017-12-28

## 2017-12-24 MED ORDER — HYDROCODONE-ACETAMINOPHEN 5 MG-325 MG TAB
5-325 mg | ORAL_TABLET | Freq: Four times a day (QID) | ORAL | 0 refills | Status: AC | PRN
Start: 2017-12-24 — End: 2017-12-25

## 2017-12-24 MED ORDER — TRIMETHOPRIM-SULFAMETHOXAZOLE 160 MG-800 MG TAB
160-800 mg | ORAL | Status: AC
Start: 2017-12-24 — End: 2017-12-23
  Administered 2017-12-24: 02:00:00 via ORAL

## 2017-12-24 MED FILL — HYDROCODONE-ACETAMINOPHEN 5 MG-325 MG TAB: 5-325 mg | ORAL | Qty: 1

## 2017-12-24 MED FILL — TRIMETHOPRIM-SULFAMETHOXAZOLE 160 MG-800 MG TAB: 160-800 mg | ORAL | Qty: 2
